# Patient Record
Sex: Female | Born: 1990 | Race: White | Hispanic: No | Marital: Single | Smoking: Never smoker
Health system: Southern US, Community
[De-identification: ages and names within clinical notes are randomized; demographics above are authoritative.]

## PROBLEM LIST (undated history)

## (undated) DIAGNOSIS — J45909 Unspecified asthma, uncomplicated: Secondary | ICD-10-CM

---

## 2016-06-06 ENCOUNTER — Ambulatory Visit (HOSPITAL_COMMUNITY)
Admission: EM | Admit: 2016-06-06 | Discharge: 2016-06-06 | Disposition: A | Discharge status: Home / Self Care | Payer: BLUE CROSS/BLUE SHIELD | Attending: Family Medicine | Admitting: Family Medicine

## 2016-06-06 ENCOUNTER — Encounter (HOSPITAL_COMMUNITY): Payer: Self-pay | Admitting: Emergency Medicine

## 2016-06-06 DIAGNOSIS — M436 Torticollis: Secondary | ICD-10-CM | POA: Diagnosis not present

## 2016-06-06 HISTORY — DX: Unspecified asthma, uncomplicated: J45.909

## 2016-06-06 MED ORDER — CYCLOBENZAPRINE HCL 10 MG PO TABS: 10.0000 mg | ORAL_TABLET | Freq: Two times a day (BID) | ORAL | 0 refills | Status: AC | PRN

## 2016-06-06 NOTE — ED Triage Notes (Signed)
[facility] with a complaint of neck pain that radiates into her back that started last night. The patient reported that she went to the allergy doctor and received a shot in her right arm and the neck pain started that evening. The patient also reported some tingling to her right arm that started 3 days ago.

## 2016-06-07 NOTE — ED Provider Notes (Signed)
CSN: [id number]     Arrival date & time 06/06/16  1208 History   None    Chief Complaint  Patient presents with  . 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 No relief with home treatment.  Past Medical History:  Diagnosis Date  . Asthma    History reviewed. No pertinent surgical history. History reviewed. No pertinent family history. Social History  Substance Use Topics  . Smoking status: Never Smoker  . Smokeless tobacco: Never Used  . Alcohol use Yes     Comment: occassional   OB History    No data available     Review of Systems  Denies: HEADACHE, NAUSEA, ABDOMINAL PAIN, CHEST PAIN, CONGESTION, DYSURIA, SHORTNESS OF BREATH  Allergies  Review of patient's allergies indicates no known allergies.  Home Medications   Prior to Admission medications   Medication Sig Start Date End Date Taking? Authorizing Provider  diphenhydrAMINE (BENADRYL) 25 MG tablet Take 25 mg by mouth every 6 (six) hours as needed.   Yes Historical Provider, MD  cyclobenzaprine (FLEXERIL) 10 MG tablet Take 1 tablet (10 mg total) by mouth 2 (two) times daily as needed for muscle spasms. 06/06/16   Tharon AquasFrank C Favor Hackler, PA   Meds Ordered and Administered this Visit  Medications - No data to display  BP 115/69 (BP Location: Left Arm)   Pulse 64   Temp 98.7 F (37.1 C) (Oral)   Resp 12   LMP 05/29/2016 (Exact Date)   SpO2 100%  No data found.   Physical Exam NURSES NOTES AND VITAL SIGNS REVIEWED. CONSTITUTIONAL: Well developed, well nourished, no acute distress HEENT: normocephalic, atraumatic, EYES: Conjunctiva normal NECK: , supple, no adenopathy, decreased ROM to left, and flexion of neck. Palpable spasm left trapezius.  PULMONARY:No respiratory distress, normal effort ABDOMINAL: Soft, ND, NT BS+, No CVAT MUSCULOSKELETAL: Normal ROM of all extremities,   SKIN: warm and dry without rash PSYCHIATRIC: Mood and affect, behavior are normal  Urgent Care Course   Clinical Course    Procedures (including critical care time)  Labs Review Labs Reviewed - No data to display  Imaging Review No results found.   Visual Acuity Review  Right Eye Distance:   Left Eye Distance:   Bilateral Distance:    Right Eye Near:   Left Eye Near:    Bilateral Near:        flexeril MDM   1. Torticollis, acute     Patient is reassured that there are no issues that require transfer to higher level of care at this time or additional tests. Patient is advised to continue home symptomatic treatment. Patient is advised that if there are new or worsening symptoms to attend the emergency department, contact primary care provider, or return to UC. Instructions of care provided discharged home in stable condition.    THIS NOTE WAS GENERATED USING A VOICE RECOGNITION SOFTWARE PROGRAM. ALL REASONABLE EFFORTS  WERE MADE TO PROOFREAD THIS DOCUMENT FOR ACCURACY.  I have verbally reviewed the discharge instructions with the patient. A printed AVS was given to the patient.  All questions were answered prior to discharge.      Tharon AquasFrank C Katiejo Gilroy, PA 06/07/16 [phone]

## 2018-02-11 ENCOUNTER — Other Ambulatory Visit: Payer: Self-pay | Admitting: Family Medicine

## 2018-02-11 DIAGNOSIS — R079 Chest pain, unspecified: Secondary | ICD-10-CM

## 2018-02-16 ENCOUNTER — Ambulatory Visit
Admission: RE | Admit: 2018-02-16 | Discharge: 2018-02-16 | Disposition: A | Discharge status: Home / Self Care | Payer: BLUE CROSS/BLUE SHIELD | Source: Ambulatory Visit | Attending: Family Medicine | Admitting: Family Medicine

## 2018-02-16 DIAGNOSIS — R079 Chest pain, unspecified: Secondary | ICD-10-CM

## 2018-02-16 MED ORDER — IOPAMIDOL (ISOVUE-370) INJECTION 76%
75.0000 mL | Freq: Once | INTRAVENOUS | Status: AC | PRN
  Administered 2018-02-16: 75 mL via INTRAVENOUS

## 2018-05-17 ENCOUNTER — Encounter (HOSPITAL_COMMUNITY): Payer: Self-pay

## 2018-05-17 ENCOUNTER — Ambulatory Visit (HOSPITAL_COMMUNITY)
Admission: EM | Admit: 2018-05-17 | Discharge: 2018-05-17 | Disposition: A | Discharge status: Home / Self Care | Payer: BLUE CROSS/BLUE SHIELD | Attending: Family Medicine | Admitting: Family Medicine

## 2018-05-17 DIAGNOSIS — K219 Gastro-esophageal reflux disease without esophagitis: Secondary | ICD-10-CM

## 2018-05-17 MED ORDER — OMEPRAZOLE 20 MG PO CPDR: 20.0000 mg | DELAYED_RELEASE_CAPSULE | Freq: Every day | ORAL | 0 refills | Status: AC

## 2018-05-17 MED ORDER — GI COCKTAIL ~~LOC~~
30.0000 mL | Freq: Once | ORAL | Status: AC
  Administered 2018-05-17: 30 mL via ORAL

## 2018-05-17 MED ORDER — GI COCKTAIL ~~LOC~~
ORAL | Status: AC
  Filled 2018-05-17: qty 30

## 2018-05-17 MED ORDER — RANITIDINE HCL 150 MG PO CAPS: 150.0000 mg | ORAL_CAPSULE | Freq: Every day | ORAL | 0 refills | Status: AC

## 2018-05-17 NOTE — ED Provider Notes (Signed)
[facility]    CSN: [id number] Arrival date & time: 05/17/18  1448     History   Chief Complaint Chief Complaint  Patient presents with  . Sore Throat  . Gastroesophageal Reflux    HPI Alejandra Stevens is a 27 y.o. female.   Pt is a 27 year old female that presents with 3 days of indigestion, and sensation that something is stuck in her esophagus.  This all started after being at the beach this weekend with her friends and consuming alcohol and late night eating, with possibly overeating.  She woke up the next morning and felt like she had some acid reflux.  She tried eating and drinking fluids to get it to feel better but still feels like there is foreign body in her esophagus. She does enjoy coffee and certain aggravating foods.  She does not have any abdominal pain, chest pain, palpatations,  shortness of breath, coughing or congestion associated with this.  She denies any runny nose, sneezing, watery eyes.  She does not have any nausea, vomiting.  She has not taken any over-the-counter acid reduction medication for her symptoms.    She does not have any history of acid reflux She does not smoke  ROS per HPI      Past Medical History:  Diagnosis Date  . Asthma     There are no active problems to display for this patient.   History reviewed. No pertinent surgical history.  OB History   None      Home Medications    Prior to Admission medications   Medication Sig Start Date End Date Taking? Authorizing Provider  cyclobenzaprine (FLEXERIL) 10 MG tablet Take 1 tablet (10 mg total) by mouth 2 (two) times daily as needed for muscle spasms. 06/06/16   Tharon AquasPatrick, Frank C, PA  diphenhydrAMINE (BENADRYL) 25 MG tablet Take 25 mg by mouth every 6 (six) hours as needed.    [provider]  omeprazole (PRILOSEC) 20 MG capsule Take 1 capsule (20 mg total) by mouth daily. 05/17/18   Dahlia ByesBast, Morganna Styles A, NP  ranitidine (ZANTAC) 150 MG capsule Take 1 capsule (150 mg  total) by mouth daily. 05/17/18   Janace ArisBast, Jiro Kiester A, NP    Family History History reviewed. No pertinent family history.  Social History Social History   Tobacco Use  . Smoking status: Never Smoker  . Smokeless tobacco: Never Used  Substance Use Topics  . Alcohol use: Yes    Comment: occassional  . Drug use: No     Allergies   Gold-containing drug products and Nickel   Review of Systems Review of Systems   Physical Exam Triage Vital Signs ED Triage Vitals  Enc Vitals Group     BP 05/17/18 1517 116/70     Pulse Rate 05/17/18 1511 73     Resp 05/17/18 1511 19     Temp 05/17/18 1511 98 F (36.7 C)     Temp Source 05/17/18 1511 Oral     SpO2 05/17/18 1511 97 %     Weight --      Height --      Head Circumference --      Peak Flow --      Pain Score --      Pain Loc --      Pain Edu? --      Excl. in GC? --    No data found.  Updated Vital Signs BP 116/70   Pulse 73  Temp 98 F (36.7 C) (Oral)   Resp 19   LMP 04/13/2018   SpO2 97%   Visual Acuity Right Eye Distance:   Left Eye Distance:   Bilateral Distance:    Right Eye Near:   Left Eye Near:    Bilateral Near:     Physical Exam  Constitutional: She appears well-developed and well-nourished.  Non-toxic appearance. She does not appear ill. No distress.  HENT:  Head: Normocephalic and atraumatic.  Right Ear: Hearing normal.  Left Ear: Hearing normal.  Mouth/Throat: Oropharynx is clear and moist and mucous membranes are normal. No oral lesions. No uvula swelling. No oropharyngeal exudate, posterior oropharyngeal edema, posterior oropharyngeal erythema or tonsillar abscesses. Tonsils are 0 on the right. Tonsils are 0 on the left. No tonsillar exudate.  No tonsillar swelling, erythema or exudates.   Eyes: Pupils are equal, round, and reactive to light.  Neck: Normal range of motion.  Cardiovascular: Normal rate and normal heart sounds.  Pulmonary/Chest: Effort normal.  Abdominal: Soft. No hernia.    Soft, non tender abdomen. No masses or rebound.  Deep palpation of the epigastric area elicited some discomfort and some sensation of acid coming back up into her throat.   Lymphadenopathy:    She has no cervical adenopathy.  Neurological: She is alert.  Skin: Skin is warm and dry. Capillary refill takes less than 2 seconds.  Psychiatric: She has a normal mood and affect.  Nursing note and vitals reviewed.    UC Treatments / Results  Labs (all labs ordered are listed, but only abnormal results are displayed) Labs Reviewed - No data to display  EKG None  Radiology No results found.  Procedures Procedures (including critical care time)  Medications Ordered in UC Medications  gi cocktail (Maalox,Lidocaine,Donnatal) (30 mLs Oral Given 05/17/18 1533)    Initial Impression / Assessment and Plan / UC Course  I have reviewed the triage vital signs and the nursing notes.  Pertinent labs & imaging results that were available during my care of the patient were reviewed by me and considered in my medical decision making (see chart for details).     Will give GI cocktail and reassess.   Feels slightly better after GI cocktail. Most likely her symptoms are related to GERD. No other worrisome signs or symptoms on exam.  We will try omeprazole daily and ranitidine as needed at night.  GI follow up as needed if no symptom improvement in the next  Month.   Final Clinical Impressions(s) / UC Diagnoses   Final diagnoses:  Gastroesophageal reflux disease, esophagitis presence not specified     Discharge Instructions     I believe your symptoms are associated with acid reflux.  We are going to try omeprazole 20 mg once daily and zantac 150 mg at night.  Make sure that you take the omeprazole 30- 60 minutes prior to a meal with a glass of water.  Avoid spicy, greasy foods, caffeine, chocolate and milk products.  No eating 2-3 hours before bedtime. Elevate the head of the bed 30  degrees.  Try this for a few weeks to see if this improves your symptoms.  If you don't see any improvement or your symptoms worsen please follow up with a GI      ED Prescriptions    Medication Sig Dispense Auth. Provider   omeprazole (PRILOSEC) 20 MG capsule Take 1 capsule (20 mg total) by mouth daily. 30 capsule Tavion Senkbeil A, NP   ranitidine (ZANTAC)  150 MG capsule Take 1 capsule (150 mg total) by mouth daily. 30 capsule Dahlia ByesBast, Jemaine Prokop A, NP     Controlled Substance Prescriptions [place] Controlled Substance Registry consulted? Not Applicable   Janace ArisBast, Kayleann Mccaffery A, NP 05/17/18 1626

## 2018-05-17 NOTE — ED Triage Notes (Signed)
Pt presents with a sore throat that she believes to be from really bad indigestion

## 2018-05-17 NOTE — Discharge Instructions (Signed)
I believe your symptoms are associated with acid reflux.  °We are going to try omeprazole 20 mg once daily and zantac 150 mg at night.  °Make sure that you take the omeprazole 30- 60 minutes prior to a meal with a glass of water.  °Avoid spicy, greasy foods, caffeine, chocolate and milk products.  °No eating 2-3 hours before bedtime. Elevate the head of the bed 30 degrees.  °Try this for a few weeks to see if this improves your symptoms.  °If you don't see any improvement or your symptoms worsen please follow up with a GI   °

## 2019-02-06 ENCOUNTER — Other Ambulatory Visit: Payer: Self-pay

## 2019-02-06 ENCOUNTER — Ambulatory Visit (HOSPITAL_COMMUNITY)
Admission: EM | Admit: 2019-02-06 | Discharge: 2019-02-06 | Disposition: A | Discharge status: Home / Self Care | Payer: BLUE CROSS/BLUE SHIELD | Source: Home / Self Care

## 2019-02-06 ENCOUNTER — Encounter: Payer: Self-pay | Admitting: Physician Assistant

## 2019-02-06 ENCOUNTER — Emergency Department (HOSPITAL_COMMUNITY)
Admission: EM | Admit: 2019-02-06 | Discharge: 2019-02-06 | Disposition: A | Discharge status: Home / Self Care | Payer: BLUE CROSS/BLUE SHIELD | Attending: Emergency Medicine | Admitting: Emergency Medicine

## 2019-02-06 ENCOUNTER — Telehealth: Payer: BLUE CROSS/BLUE SHIELD | Admitting: Physician Assistant

## 2019-02-06 ENCOUNTER — Encounter (HOSPITAL_COMMUNITY): Payer: Self-pay | Admitting: Emergency Medicine

## 2019-02-06 ENCOUNTER — Emergency Department (HOSPITAL_COMMUNITY): Payer: BLUE CROSS/BLUE SHIELD

## 2019-02-06 DIAGNOSIS — J45909 Unspecified asthma, uncomplicated: Secondary | ICD-10-CM | POA: Diagnosis not present

## 2019-02-06 DIAGNOSIS — R0602 Shortness of breath: Secondary | ICD-10-CM | POA: Diagnosis not present

## 2019-02-06 DIAGNOSIS — Z0189 Encounter for other specified special examinations: Secondary | ICD-10-CM

## 2019-02-06 DIAGNOSIS — R7989 Other specified abnormal findings of blood chemistry: Secondary | ICD-10-CM

## 2019-02-06 DIAGNOSIS — Z79899 Other long term (current) drug therapy: Secondary | ICD-10-CM | POA: Diagnosis not present

## 2019-02-06 DIAGNOSIS — R791 Abnormal coagulation profile: Secondary | ICD-10-CM | POA: Insufficient documentation

## 2019-02-06 DIAGNOSIS — R899 Unspecified abnormal finding in specimens from other organs, systems and tissues: Secondary | ICD-10-CM

## 2019-02-06 DIAGNOSIS — Z789 Other specified health status: Secondary | ICD-10-CM

## 2019-02-06 LAB — COMPREHENSIVE METABOLIC PANEL
ALT: 14 U/L (ref 0–44)
AST: 21 U/L (ref 15–41)
Albumin: 3.9 g/dL (ref 3.5–5.0)
Alkaline Phosphatase: 60 U/L (ref 38–126)
Anion gap: 8 (ref 5–15)
BUN: 12 mg/dL (ref 6–20)
CO2: 26 mmol/L (ref 22–32)
Calcium: 9.1 mg/dL (ref 8.9–10.3)
Chloride: 105 mmol/L (ref 98–111)
Creatinine, Ser: 0.74 mg/dL (ref 0.44–1.00)
GFR calc Af Amer: >60 mL/min (ref >60)
GFR calc non Af Amer: >60 mL/min (ref >60)
Glucose, Bld: 95 mg/dL (ref 70–99)
Potassium: 4.3 mmol/L (ref 3.5–5.1)
Sodium: 139 mmol/L (ref 135–145)
Total Bilirubin: 0.5 mg/dL (ref 0.3–1.2)
Total Protein: 6.9 g/dL (ref 6.5–8.1)

## 2019-02-06 LAB — I-STAT BETA HCG BLOOD, ED (MC, WL, AP ONLY): I-stat hCG, quantitative: <5 m[IU]/mL (ref <5)

## 2019-02-06 LAB — CBC WITH DIFFERENTIAL/PLATELET
Abs Immature Granulocytes: 0.02 10*3/uL (ref 0.00–0.07)
Basophils Absolute: 0 10*3/uL (ref 0.0–0.1)
Basophils Relative: 0 %
Eosinophils Absolute: 0 10*3/uL (ref 0.0–0.5)
Eosinophils Relative: 0 %
HCT: 41.8 % (ref 36.0–46.0)
Hemoglobin: 13.5 g/dL (ref 12.0–15.0)
Immature Granulocytes: 0 %
Lymphocytes Relative: 32 %
Lymphs Abs: 2.3 10*3/uL (ref 0.7–4.0)
MCH: 30.1 pg (ref 26.0–34.0)
MCHC: 32.3 g/dL (ref 30.0–36.0)
MCV: 93.3 fL (ref 80.0–100.0)
Monocytes Absolute: 0.5 10*3/uL (ref 0.1–1.0)
Monocytes Relative: 7 %
Neutro Abs: 4.3 10*3/uL (ref 1.7–7.7)
Neutrophils Relative %: 61 %
Platelets: 237 10*3/uL (ref 150–400)
RBC: 4.48 MIL/uL (ref 3.87–5.11)
RDW: 12.9 % (ref 11.5–15.5)
WBC: 7.1 10*3/uL (ref 4.0–10.5)
nRBC: 0 % (ref 0.0–0.2)

## 2019-02-06 LAB — D-DIMER, QUANTITATIVE: D-Dimer, Quant: <0.27 ug/mL-FEU (ref 0.00–0.50)

## 2019-02-06 NOTE — ED Provider Notes (Signed)
[facility] EMERGENCY DEPARTMENT Provider Note   CSN: [id number] Arrival date & time: 02/06/19  1348    History   Chief Complaint Chief Complaint  Patient presents with  . Abnormal Lab    d.dimer 6.9  . Cough    HPI Jessicalynn Kingcade is a 28 y.o. female with past medical history of asthma, presenting to the emergency department after an elevated d-dimer last week by her PCP.  Patient states about 1 year ago she was on OCPs and had an elevated d-dimer with a negative CTA chest.  She states in September she switched to a copper IUD.  Last week she saw her PCP, a PA at the [facility].  She saw her PCP for concerns regarding her menstrual cycle.  She states when she was having labs drawn her PA realized she had not had a recheck of her d-dimer since last year and checked it.  She states it was noted to be elevated at 6.9.  She has had no shortness of breath, leg pain or swelling.  She has no history of PE or DVT.  No current exogenous estrogen use, no recent trauma or surgery, no recent immobilization, no history of cancer.  She states she may be coming down with a cold, however does not need to be evaluated for that today and feels she can manage her symptoms at home.  She is here for evaluation of the elevated d-dimer.     The history is provided by the patient.    Past Medical History:  Diagnosis Date  . Asthma     There are no active problems to display for this patient.   History reviewed. No pertinent surgical history.   OB History   No obstetric history on file.      Home Medications    Prior to Admission medications   Medication Sig Start Date End Date Taking? Authorizing Provider  cyclobenzaprine (FLEXERIL) 10 MG tablet Take 1 tablet (10 mg total) by mouth 2 (two) times daily as needed for muscle spasms. 06/06/16   Tharon Aquas, PA  diphenhydrAMINE (BENADRYL) 25 MG tablet Take 25 mg by mouth every 6 (six) hours as needed.     [provider]  omeprazole (PRILOSEC) 20 MG capsule Take 1 capsule (20 mg total) by mouth daily. 05/17/18   Dahlia Byes A, NP  ranitidine (ZANTAC) 150 MG capsule Take 1 capsule (150 mg total) by mouth daily. 05/17/18   Janace Aris, NP    Family History No family history on file.  Social History Social History   Tobacco Use  . Smoking status: Never Smoker  . Smokeless tobacco: Never Used  Substance Use Topics  . Alcohol use: Yes    Comment: occassional  . Drug use: No     Allergies   Gold-containing drug products and Nickel   Review of Systems Review of Systems  All other systems reviewed and are negative.    Physical Exam Updated Vital Signs BP 114/66 (BP Location: Right Arm)   Pulse 71   Temp 98.8 F (37.1 C) (Oral)   Resp 17   LMP 01/23/2019   SpO2 100%   Physical Exam Vitals signs and nursing note reviewed.  Constitutional:      General: She is not in acute distress.    Appearance: She is well-developed.  HENT:     Head: Normocephalic and atraumatic.     Right Ear: Tympanic membrane and ear canal  normal.     Left Ear: Tympanic membrane and ear canal normal.     Mouth/Throat:     Mouth: Mucous membranes are moist.     Pharynx: Oropharynx is clear.  Eyes:     Conjunctiva/sclera: Conjunctivae normal.  Neck:     Musculoskeletal: Normal range of motion and neck supple. No neck rigidity or muscular tenderness.  Cardiovascular:     Rate and Rhythm: Normal rate and regular rhythm.  Pulmonary:     Effort: Pulmonary effort is normal. No respiratory distress.     Breath sounds: Normal breath sounds.  Musculoskeletal:        General: No tenderness.     Right lower leg: No edema.     Left lower leg: No edema.  Lymphadenopathy:     Cervical: No cervical adenopathy.  Neurological:     Mental Status: She is alert.  Psychiatric:        Mood and Affect: Mood normal.        Behavior: Behavior normal.      ED Treatments / Results  Labs (all labs  ordered are listed, but only abnormal results are displayed) Labs Reviewed  CBC WITH DIFFERENTIAL/PLATELET  COMPREHENSIVE METABOLIC PANEL  D-DIMER, QUANTITATIVE (NOT AT [facility])  I-STAT BETA HCG BLOOD, ED ([facility], [facility], [facility] ONLY)    EKG None  Radiology Dg Chest 2 View  Result Date: 02/06/2019 CLINICAL DATA:  28 year old female with shortness of breath and chest tightness EXAM: CHEST - 2 VIEW COMPARISON:  CT 02/16/2018 FINDINGS: The heart size and mediastinal contours are within normal limits. Both lungs are clear. The visualized skeletal structures are unremarkable. IMPRESSION: Negative for acute cardiopulmonary disease Electronically Signed   By: Gilmer MorJaime  Wagner D.O.   On: 02/06/2019 14:48    Procedures Procedures (including critical care time)  Medications Ordered in ED Medications - No data to display   Initial Impression / Assessment and Plan / ED Course  I have reviewed the triage vital signs and the nursing notes.  Pertinent labs & imaging results that were available during my care of the patient were reviewed by me and considered in my medical decision making (see chart for details).        Patient presenting today after a an elevated d-dimer by PCP last week.  She states that d-dimer was checked to see if it had changed since an elevation last year.  She has no risk factors for PE/DVT.  She has no symptoms concerning for clot.  She has mild cold-like symptoms, however does not request evaluation for that today.  Vital signs are normal.  Lungs are clear.  Labs obtained in triage revealed a normal d-dimer, less than 0.27.  Normal metabolic panel and blood counts. CXR neg. Provided reassurance to patient and recommendation to follow-up with PCP as needed.  In the setting of recurrent COVID-19 pandemic, recommend patient self isolate during her URI symptoms.  Patient verbalized understanding and agrees with care plan.   Discussed results, findings, treatment and follow up. Patient advised  of return precautions. Patient verbalized understanding and agreed with plan.   Final Clinical Impressions(s) / ED Diagnoses   Final diagnoses:  D dimer value normal    ED Discharge Orders    None       Darrnell Mangiaracina, [place] N, PA-C 02/06/19 1707    Margarita Grizzleay, Danielle, MD 02/07/19 1040

## 2019-02-06 NOTE — ED Provider Notes (Signed)
[facility]    CSN: [id number] Arrival date & time: 02/06/19  1317     History   Chief Complaint Chief Complaint  Patient presents with  . Abnormal Lab  . Shortness of Breath  . Headache    HPI Alejandra Stevens is a 28 y.o. female comes to urgent care to be evaluated for elevated d-dimer of 6.9.  Patient is a young female with no significant past medical history comes to urgent care for above-mentioned visit.  Patient started having shortness of breath, headache without a fever or generalized body aches over the past several days.  She was evaluated in her primary care physician's office last week and a d-dimer drawn was elevated.  Patient denies any cough swelling or calf pain.  She had crampy lower abdominal pain last week and this is currently resolved.  No vaginal discharge.  Patient had negative STD work-up.  He subsequently had some shortness of breath with cough and a headache.  No fever or chills.  No dysuria urgency or frequency.  No cough pain.  No long-distance travel.  She denies any family history of hypercoagulable disorder.  Patient also describes moderate lower back pain.  Pain is associated with shortness of breath.  No trauma to the back.  No relieving factors.  Cough is not productive of sputum. HPI  Past Medical History:  Diagnosis Date  . Asthma     There are no active problems to display for this patient.   No past surgical history on file.  OB History   No obstetric history on file.      Home Medications    Prior to Admission medications   Medication Sig Start Date End Date Taking? Authorizing Provider  cyclobenzaprine (FLEXERIL) 10 MG tablet Take 1 tablet (10 mg total) by mouth 2 (two) times daily as needed for muscle spasms. 06/06/16   Tharon AquasPatrick, Frank C, PA  diphenhydrAMINE (BENADRYL) 25 MG tablet Take 25 mg by mouth every 6 (six) hours as needed.    [provider]  omeprazole (PRILOSEC) 20 MG capsule Take 1 capsule (20 mg total) by  mouth daily. 05/17/18   Dahlia ByesBast, Traci A, NP  ranitidine (ZANTAC) 150 MG capsule Take 1 capsule (150 mg total) by mouth daily. 05/17/18   Janace ArisBast, Traci A, NP    Family History No family history on file.  Social History Social History   Tobacco Use  . Smoking status: Never Smoker  . Smokeless tobacco: Never Used  Substance Use Topics  . Alcohol use: Yes    Comment: occassional  . Drug use: No     Allergies   Gold-containing drug products and Nickel   Review of Systems Review of Systems  Eyes: Negative.   Respiratory: Positive for cough and shortness of breath. Negative for chest tightness and wheezing.   Cardiovascular: Negative for chest pain and palpitations.  Genitourinary: Negative.   Musculoskeletal: Negative.  Negative for joint swelling and neck pain.  Skin: Negative for rash.  Neurological: Negative.   Hematological: Negative.   All other systems reviewed and are negative.    Physical Exam Triage Vital Signs ED Triage Vitals [02/06/19 1332]  Enc Vitals Group     BP 122/78     Pulse Rate 79     Resp 18     Temp      Temp src      SpO2 100 %     Weight      Height  Head Circumference      Peak Flow      Pain Score      Pain Loc      Pain Edu?      Excl. in GC?    No data found.  Updated Vital Signs BP 122/78   Pulse 79   Resp 18   SpO2 100%   Visual Acuity Right Eye Distance:   Left Eye Distance:   Bilateral Distance:    Right Eye Near:   Left Eye Near:    Bilateral Near:     Physical Exam Constitutional:      Appearance: She is well-developed.  Cardiovascular:     Rate and Rhythm: Normal rate and regular rhythm.  Pulmonary:     Breath sounds: No decreased breath sounds, wheezing or rhonchi.  Chest:     Chest wall: No mass, deformity or tenderness.  Abdominal:     Palpations: Abdomen is soft. There is no hepatomegaly or mass.     Tenderness: There is no guarding.  Musculoskeletal: Normal range of motion.     Right lower leg: No  edema.     Left lower leg: No edema.  Skin:    General: Skin is warm.     Capillary Refill: Capillary refill takes less than 2 seconds.     Coloration: Skin is not cyanotic or pale.  Neurological:     General: No focal deficit present.     Mental Status: She is alert.      UC Treatments / Results  Labs (all labs ordered are listed, but only abnormal results are displayed) Labs Reviewed - No data to display  EKG None  Radiology No results found.  Procedures Procedures (including critical care time)  Medications Ordered in UC Medications - No data to display  Initial Impression / Assessment and Plan / UC Course  I have reviewed the triage vital signs and the nursing notes.  Pertinent labs & imaging results that were available during my care of the patient were reviewed by me and considered in my medical decision making (see chart for details).     1.  Shortness of breath with self-reported elevated d-dimer of 6.9: Patient is advised to go to the emergency department for further evaluation. Wells score is low given that the patient is not tachycardic pulse oximetry is 100% on room air Patient is agreeable to being evaluated in the emergency department  Regarding shortness of breath and cough with a headache, we discussed the possibility of COVID-19 infection.  Patient is advised to self quarantine as his symptoms evolve or resolves.  At this time patient is day #7 after his symptoms of shortness of breath with cough and headache started. Final Clinical Impressions(s) / UC Diagnoses   Final diagnoses:  Shortness of breath  D-dimer, elevated   Discharge Instructions   None    ED Prescriptions    None     Controlled Substance Prescriptions [place] Controlled Substance Registry consulted? No   Merrilee Jansky, MD 02/06/19 1357

## 2019-02-06 NOTE — Progress Notes (Signed)
Based on what you shared with me, I feel your condition warrants further evaluation and I recommend that you be seen for a face to face office visit.   Alejandra Stevens, Your symptoms and concern warrant that you have a face to face evaluation. Having an elevated D dimer might suggest a blood clot in the legs on the lungs. It is recommended that you reach out to your doctor to discuss the D dimer findings. If you shortness of breath worsens, go to the ER    NOTE: If you entered your credit card information for this eVisit, you will not be charged. You may see a "hold" on your card for the $35 but that hold will drop off and you will not have a charge processed.  If you are having a true medical emergency please call 911.  If you need an urgent face to face visit, [place] has four urgent care centers for your convenience.    PLEASE NOTE: THE [facility] LOCATIONS AND URGENT CARE CLINICS DO NOT HAVE THE TESTING FOR CORONAVIRUS COVID19 AVAILABLE.  IF YOU FEEL YOU NEED THIS TEST YOU MUST GO TO A TRIAGE LOCATION AT ONE OF THE HOSPITAL EMERGENCY DEPARTMENTS   [email or web address] to reserve your spot online an avoid wait times  [facility] [address], [address] [place], [place] [address] Modified hours of operation: Monday-Friday, 12 PM to 6 PM  Saturday & Sunday 10 AM to 4 PM *Across the street from [employer]  [employer] (New Address!) [address], [address] [place], [place] [address] *Just off [employer], across the road from [place]* Modified hours of operation: Monday-Friday, 12 PM to 6 PM  Closed Saturday & Sunday  [facility]'s modified hours of operation will be in effect from May 1 until May 31   The following sites will take your insurance:  . [facility]  [phone] Get Driving Directions Find a Provider at this Location  [address] [place], [place] [address] . 10 am to 8 pm Monday-Friday . 12 pm  to 8 pm Saturday-Sunday   . [facility] at [facility]  [phone] Get Driving Directions Find a Provider at this Location  [address] [place], [place] [address] . 8 am to 8 pm Monday-Friday . 9 am to 6 pm Saturday . 11 am to 6 pm Sunday   . [facility] at [facility]  [phone] Get Driving Directions  [address] [place], [place] [address] . 8 am to 8 pm Monday-Friday . 8 am to 4 pm Saturday-Sunday   Your e-visit answers were reviewed by a board certified advanced clinical practitioner to complete your personal care plan.  Thank you for using e-Visits. I have spent 7 min in completion and review of this note- Illa Level [facility]

## 2019-02-06 NOTE — ED Notes (Signed)
Discharge instructions discussed with Pt. Pt verbalized understanding. Pt stable and ambulatory.    

## 2019-02-06 NOTE — ED Triage Notes (Signed)
Dr. Leonides Grills with patient.

## 2019-02-06 NOTE — ED Triage Notes (Addendum)
Pt reports being told that she had an elevated d.dimer at her PCP. She reports not feeling right and anxious about the results. When she woke up she had a mild cough, a little sob, and headache. She is being sent here by UC where she was seen today.

## 2019-02-06 NOTE — Discharge Instructions (Addendum)
Follow up with your primary care provider as needed. It is recommended you self isolate until at least 1 week after your symptoms began and 3 days after they resolve.

## 2019-02-06 NOTE — ED Notes (Signed)
Patient is being transferred from the Urgent Care Center to the Emergency Department. Patient is in need of higher level of care due to reports of elevated d-dimer and continued shortness of breath. Patient is aware and verbalizes understanding of plan of care.  Vitals:   02/06/19 1332  BP: 122/78  Pulse: 79  Resp: 18  SpO2: 100%

## 2020-09-29 IMAGING — CR CHEST - 2 VIEW
2 series · 2 of 2 positions shown · non-contrast
Comparison: CT 02/16/2018

CLINICAL DATA: 27-year-old female with shortness of breath and
chest tightness

EXAM:
CHEST - 2 VIEW

[chest pa]
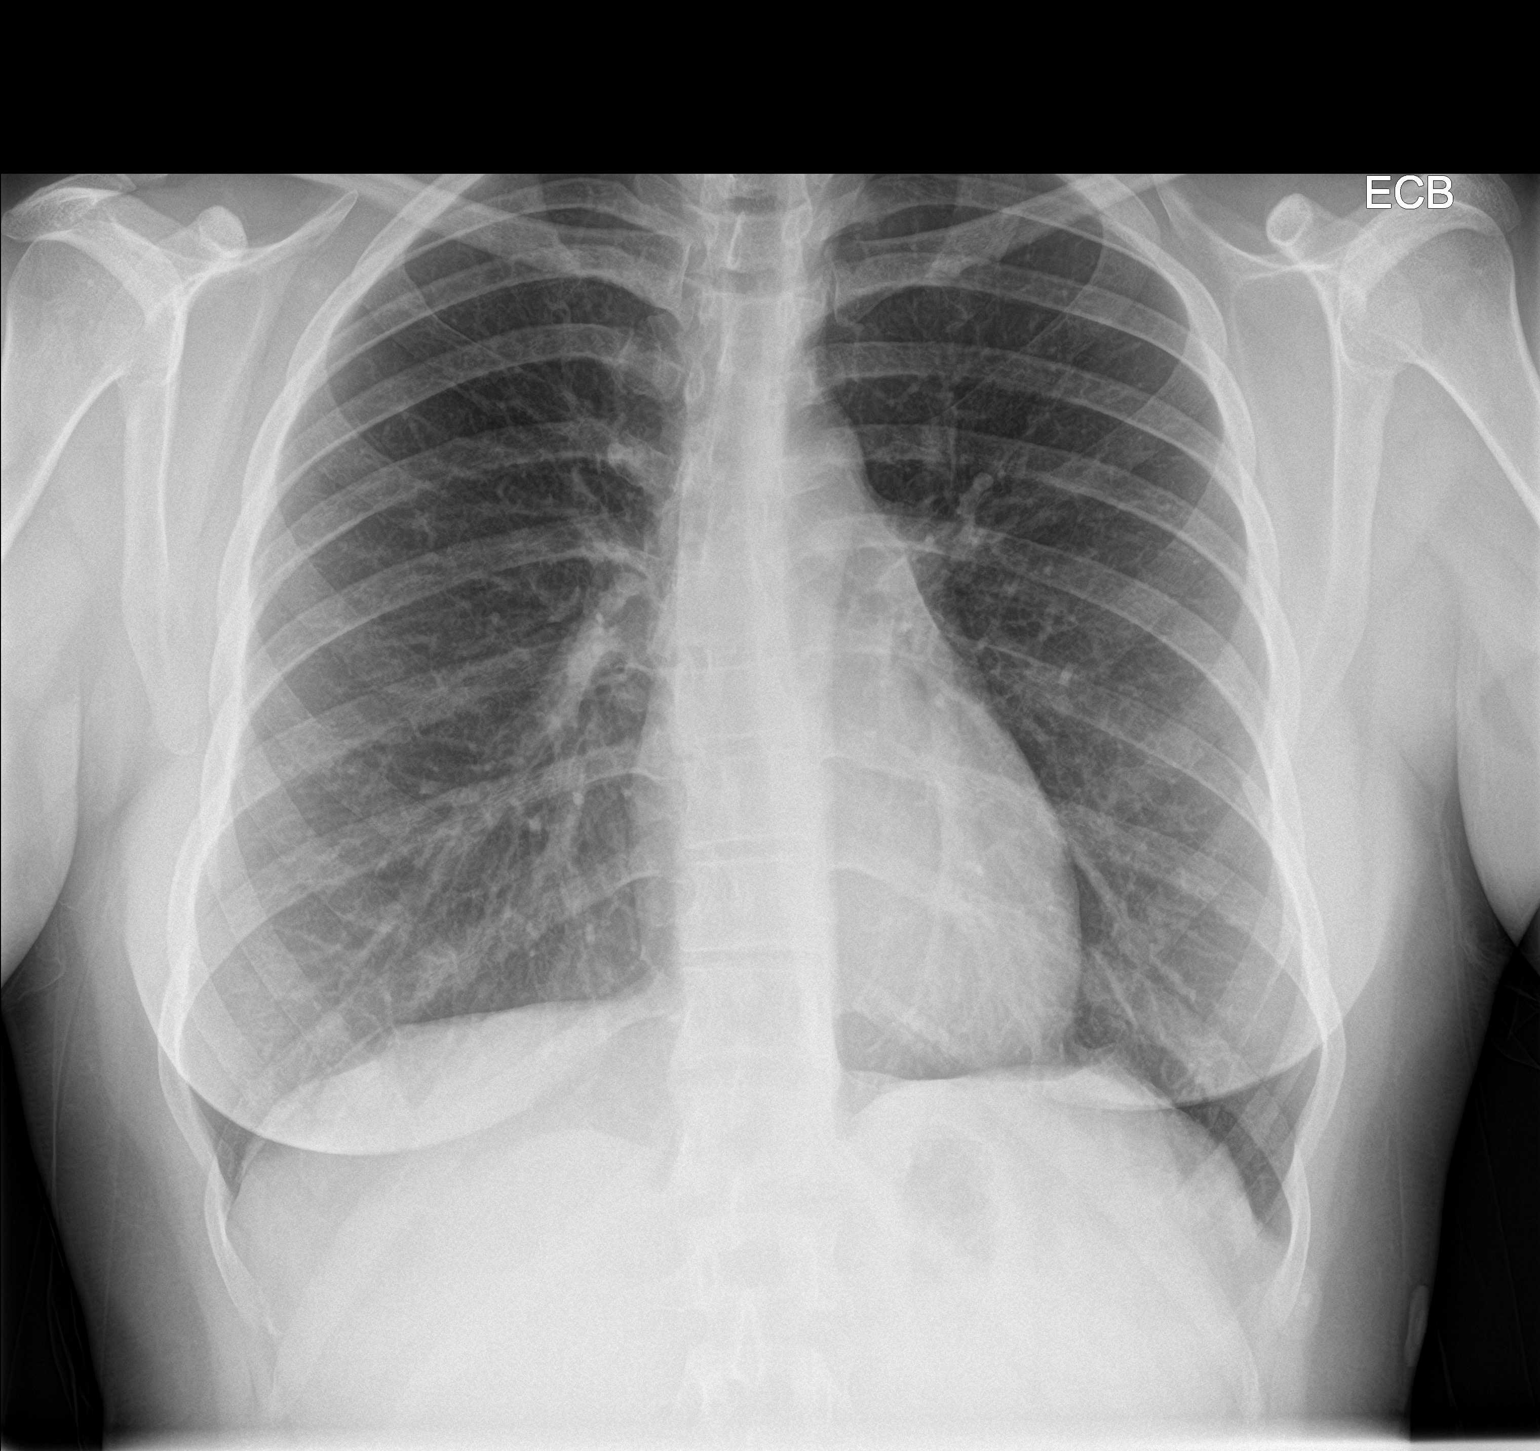

[chest lat]
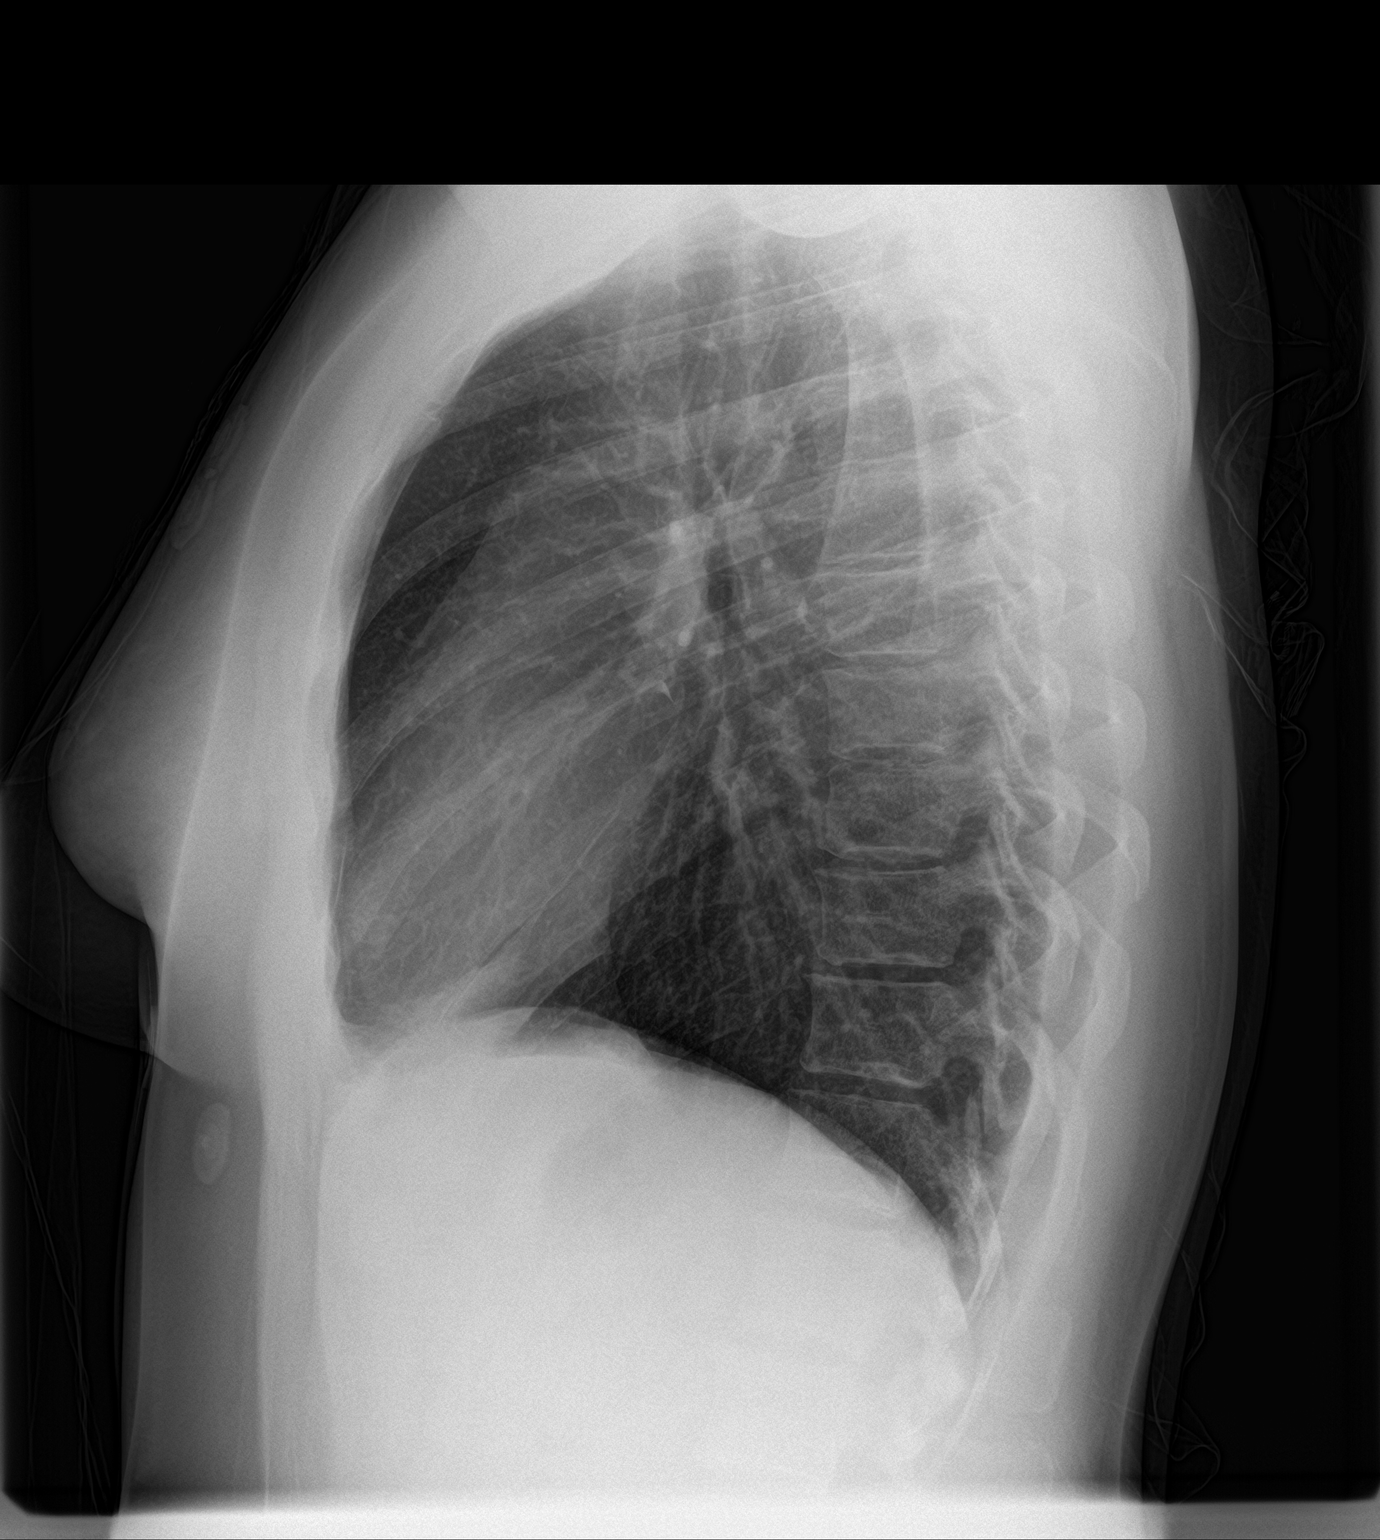

[2 of 2 positions shown; findings below may reference images not displayed]

FINDINGS: The heart size and mediastinal contours are within normal limits.
Both lungs are clear. The visualized skeletal structures are
unremarkable.
IMPRESSION: Negative for acute cardiopulmonary disease

## 2022-09-21 DIAGNOSIS — F4323 Adjustment disorder with mixed anxiety and depressed mood: Secondary | ICD-10-CM | POA: Diagnosis not present

## 2022-10-19 DIAGNOSIS — F4323 Adjustment disorder with mixed anxiety and depressed mood: Secondary | ICD-10-CM | POA: Diagnosis not present

## 2022-10-20 ENCOUNTER — Other Ambulatory Visit: Payer: Self-pay | Admitting: Family Medicine

## 2022-10-20 ENCOUNTER — Ambulatory Visit
Admission: RE | Admit: 2022-10-20 | Discharge: 2022-10-20 | Disposition: A | Discharge status: Home / Self Care | Payer: BC Managed Care – PPO | Source: Ambulatory Visit | Attending: Family Medicine | Admitting: Family Medicine

## 2022-10-20 DIAGNOSIS — Z79899 Other long term (current) drug therapy: Secondary | ICD-10-CM | POA: Diagnosis not present

## 2022-10-20 DIAGNOSIS — R002 Palpitations: Secondary | ICD-10-CM | POA: Diagnosis not present

## 2022-10-20 DIAGNOSIS — M545 Low back pain, unspecified: Secondary | ICD-10-CM

## 2022-10-20 DIAGNOSIS — J45990 Exercise induced bronchospasm: Secondary | ICD-10-CM | POA: Diagnosis not present

## 2022-10-20 DIAGNOSIS — E663 Overweight: Secondary | ICD-10-CM | POA: Diagnosis not present

## 2022-10-27 DIAGNOSIS — F4323 Adjustment disorder with mixed anxiety and depressed mood: Secondary | ICD-10-CM | POA: Diagnosis not present

## 2022-12-14 DIAGNOSIS — F4323 Adjustment disorder with mixed anxiety and depressed mood: Secondary | ICD-10-CM | POA: Diagnosis not present

## 2022-12-28 DIAGNOSIS — F4323 Adjustment disorder with mixed anxiety and depressed mood: Secondary | ICD-10-CM | POA: Diagnosis not present

## 2023-01-08 DIAGNOSIS — F4323 Adjustment disorder with mixed anxiety and depressed mood: Secondary | ICD-10-CM | POA: Diagnosis not present

## 2023-01-28 DIAGNOSIS — F4323 Adjustment disorder with mixed anxiety and depressed mood: Secondary | ICD-10-CM | POA: Diagnosis not present

## 2023-02-10 DIAGNOSIS — F4323 Adjustment disorder with mixed anxiety and depressed mood: Secondary | ICD-10-CM | POA: Diagnosis not present

## 2023-02-22 DIAGNOSIS — F4323 Adjustment disorder with mixed anxiety and depressed mood: Secondary | ICD-10-CM | POA: Diagnosis not present

## 2023-03-29 DIAGNOSIS — F4323 Adjustment disorder with mixed anxiety and depressed mood: Secondary | ICD-10-CM | POA: Diagnosis not present

## 2023-04-20 DIAGNOSIS — F4312 Post-traumatic stress disorder, chronic: Secondary | ICD-10-CM | POA: Diagnosis not present

## 2023-05-05 DIAGNOSIS — F4312 Post-traumatic stress disorder, chronic: Secondary | ICD-10-CM | POA: Diagnosis not present

## 2023-05-26 DIAGNOSIS — F4312 Post-traumatic stress disorder, chronic: Secondary | ICD-10-CM | POA: Diagnosis not present

## 2023-06-11 DIAGNOSIS — F4312 Post-traumatic stress disorder, chronic: Secondary | ICD-10-CM | POA: Diagnosis not present

## 2023-06-23 DIAGNOSIS — F4312 Post-traumatic stress disorder, chronic: Secondary | ICD-10-CM | POA: Diagnosis not present

## 2023-08-06 DIAGNOSIS — F4312 Post-traumatic stress disorder, chronic: Secondary | ICD-10-CM | POA: Diagnosis not present

## 2023-08-19 DIAGNOSIS — F4312 Post-traumatic stress disorder, chronic: Secondary | ICD-10-CM | POA: Diagnosis not present

## 2023-09-08 DIAGNOSIS — F4312 Post-traumatic stress disorder, chronic: Secondary | ICD-10-CM | POA: Diagnosis not present
